# Patient Record
Sex: Female | Born: 1940 | Race: White | Hispanic: No | Marital: Married | Smoking: Never smoker
Health system: Southern US, Community
[De-identification: ages and names within clinical notes are randomized; demographics above are authoritative.]

## PROBLEM LIST (undated history)

## (undated) DIAGNOSIS — C50919 Malignant neoplasm of unspecified site of unspecified female breast: Secondary | ICD-10-CM

## (undated) DIAGNOSIS — F419 Anxiety disorder, unspecified: Secondary | ICD-10-CM

## (undated) DIAGNOSIS — I10 Essential (primary) hypertension: Secondary | ICD-10-CM

## (undated) DIAGNOSIS — E785 Hyperlipidemia, unspecified: Secondary | ICD-10-CM

## (undated) HISTORY — DX: Anxiety disorder, unspecified: F41.9

## (undated) HISTORY — PX: MASTECTOMY: SHX3

## (undated) HISTORY — DX: Malignant neoplasm of unspecified site of unspecified female breast: C50.919

## (undated) HISTORY — DX: Essential (primary) hypertension: I10

## (undated) HISTORY — DX: Hyperlipidemia, unspecified: E78.5

---

## 2000-08-20 ENCOUNTER — Encounter: Payer: Self-pay | Admitting: Family Medicine

## 2000-08-20 ENCOUNTER — Ambulatory Visit (HOSPITAL_COMMUNITY): Admission: RE | Admit: 2000-08-20 | Discharge: 2000-08-20 | Payer: Self-pay | Admitting: Family Medicine

## 2001-09-20 ENCOUNTER — Ambulatory Visit (HOSPITAL_COMMUNITY): Admission: RE | Admit: 2001-09-20 | Discharge: 2001-09-20 | Payer: Self-pay | Admitting: Family Medicine

## 2001-09-20 ENCOUNTER — Encounter: Payer: Self-pay | Admitting: Family Medicine

## 2002-01-05 ENCOUNTER — Other Ambulatory Visit: Admission: RE | Admit: 2002-01-05 | Discharge: 2002-01-05 | Payer: Self-pay | Admitting: Unknown Physician Specialty

## 2002-10-06 ENCOUNTER — Ambulatory Visit (HOSPITAL_COMMUNITY): Admission: RE | Admit: 2002-10-06 | Discharge: 2002-10-06 | Payer: Self-pay | Admitting: Family Medicine

## 2002-10-06 ENCOUNTER — Encounter: Payer: Self-pay | Admitting: Family Medicine

## 2003-10-09 ENCOUNTER — Ambulatory Visit (HOSPITAL_COMMUNITY): Admission: RE | Admit: 2003-10-09 | Discharge: 2003-10-09 | Payer: Self-pay | Admitting: Family Medicine

## 2004-10-14 ENCOUNTER — Ambulatory Visit (HOSPITAL_COMMUNITY): Admission: RE | Admit: 2004-10-14 | Discharge: 2004-10-14 | Payer: Self-pay | Admitting: Family Medicine

## 2006-10-18 ENCOUNTER — Ambulatory Visit (HOSPITAL_COMMUNITY): Admission: RE | Admit: 2006-10-18 | Discharge: 2006-10-18 | Payer: Self-pay | Admitting: *Deleted

## 2007-11-02 ENCOUNTER — Ambulatory Visit (HOSPITAL_COMMUNITY): Admission: RE | Admit: 2007-11-02 | Discharge: 2007-11-02 | Payer: Self-pay | Admitting: *Deleted

## 2007-11-15 ENCOUNTER — Encounter: Admission: RE | Admit: 2007-11-15 | Discharge: 2007-11-15 | Payer: Self-pay | Admitting: *Deleted

## 2007-11-15 ENCOUNTER — Encounter (INDEPENDENT_AMBULATORY_CARE_PROVIDER_SITE_OTHER): Payer: Self-pay | Admitting: Diagnostic Radiology

## 2007-11-30 ENCOUNTER — Encounter: Admission: RE | Admit: 2007-11-30 | Discharge: 2007-11-30 | Payer: Self-pay | Admitting: *Deleted

## 2007-12-01 ENCOUNTER — Encounter (INDEPENDENT_AMBULATORY_CARE_PROVIDER_SITE_OTHER): Payer: Self-pay | Admitting: Diagnostic Radiology

## 2007-12-01 ENCOUNTER — Encounter: Admission: RE | Admit: 2007-12-01 | Discharge: 2007-12-01 | Payer: Self-pay | Admitting: Surgery

## 2007-12-09 ENCOUNTER — Encounter (INDEPENDENT_AMBULATORY_CARE_PROVIDER_SITE_OTHER): Payer: Self-pay | Admitting: Diagnostic Radiology

## 2007-12-09 ENCOUNTER — Encounter: Admission: RE | Admit: 2007-12-09 | Discharge: 2007-12-09 | Payer: Self-pay | Admitting: Surgery

## 2007-12-14 ENCOUNTER — Ambulatory Visit: Payer: Self-pay | Admitting: Oncology

## 2008-01-24 ENCOUNTER — Ambulatory Visit: Payer: Self-pay | Admitting: Cardiology

## 2008-01-26 ENCOUNTER — Encounter (INDEPENDENT_AMBULATORY_CARE_PROVIDER_SITE_OTHER): Payer: Self-pay | Admitting: Surgery

## 2008-01-26 ENCOUNTER — Ambulatory Visit (HOSPITAL_COMMUNITY): Admission: RE | Admit: 2008-01-26 | Discharge: 2008-01-27 | Payer: Self-pay | Admitting: Surgery

## 2008-06-05 ENCOUNTER — Encounter: Admission: RE | Admit: 2008-06-05 | Discharge: 2008-06-05 | Payer: Self-pay | Admitting: Hematology and Oncology

## 2008-07-30 ENCOUNTER — Ambulatory Visit: Admission: RE | Admit: 2008-07-30 | Discharge: 2008-09-11 | Payer: Self-pay | Admitting: Radiation Oncology

## 2008-08-20 ENCOUNTER — Ambulatory Visit: Payer: Self-pay | Admitting: Cardiology

## 2008-09-18 ENCOUNTER — Ambulatory Visit: Admission: RE | Admit: 2008-09-18 | Discharge: 2008-10-04 | Payer: Self-pay | Admitting: Radiation Oncology

## 2008-11-28 ENCOUNTER — Encounter: Admission: RE | Admit: 2008-11-28 | Discharge: 2008-11-28 | Payer: Self-pay | Admitting: Hematology and Oncology

## 2009-11-29 ENCOUNTER — Encounter: Admission: RE | Admit: 2009-11-29 | Discharge: 2009-11-29 | Payer: Self-pay | Admitting: Hematology and Oncology

## 2010-01-30 ENCOUNTER — Ambulatory Visit (HOSPITAL_BASED_OUTPATIENT_CLINIC_OR_DEPARTMENT_OTHER): Admission: RE | Admit: 2010-01-30 | Discharge: 2010-01-30 | Payer: Self-pay | Admitting: Surgery

## 2010-06-23 ENCOUNTER — Ambulatory Visit (HOSPITAL_BASED_OUTPATIENT_CLINIC_OR_DEPARTMENT_OTHER): Admission: RE | Admit: 2010-06-23 | Discharge: 2010-06-23 | Payer: Self-pay | Admitting: Surgery

## 2010-10-04 ENCOUNTER — Other Ambulatory Visit: Payer: Self-pay | Admitting: Hematology and Oncology

## 2010-10-04 DIAGNOSIS — Z9012 Acquired absence of left breast and nipple: Secondary | ICD-10-CM

## 2010-10-05 ENCOUNTER — Encounter: Payer: Self-pay | Admitting: Hematology and Oncology

## 2010-12-01 ENCOUNTER — Ambulatory Visit
Admission: RE | Admit: 2010-12-01 | Discharge: 2010-12-01 | Disposition: A | Discharge status: Home / Self Care | Payer: Medicare Other | Source: Ambulatory Visit | Attending: Hematology and Oncology | Admitting: Hematology and Oncology

## 2010-12-01 DIAGNOSIS — Z9012 Acquired absence of left breast and nipple: Secondary | ICD-10-CM

## 2011-01-27 NOTE — Op Note (Signed)
NAME:  ZIAN, Erin Shaffer NO.:  [id number]   MEDICAL RECORD NO.:  [id number]          PATIENT TYPE:  OBV   LOCATION:  5126                         FACILITY:  [facility]   PHYSICIAN:  Currie Paris, M.D.DATE OF BIRTH:  [birth year]   DATE OF PROCEDURE:  01/26/2008  DATE OF DISCHARGE:                               OPERATIVE REPORT   PREOPERATIVE DIAGNOSIS:  Left breast cancer, lower inner quadrant,  clinical stage II.   POSTOPERATIVE DIAGNOSIS:  Left breast cancer, lower inner quadrant,  clinical stage II.   OPERATION:  Left modified radical mastectomy; Port-A-Cath placement.   SURGEON:  Currie Paris, MD   ASSISTANT:  Angelia Mould. Derrell Lolling, MD   ANESTHESIA:  General.   CLINICAL HISTORY:  This is a 70 year old lady, recently found to have  left breast cancer with axillary metastasis.  After a lengthy  preoperative evaluation including on oncology consultation, she elected  to have a total mastectomy and postoperative chemotherapy.  She  understood the need for node dissection at the time of surgery.   DESCRIPTION OF PROCEDURE:  The patient was seen in the holding area and  she had no further questions.  We confirmed the above procedure as  outlined and I initialed left breast as the operative site.   The patient was taken to the operating room, and after satisfactory  general anesthesia had been obtained, the left breast was prepped and  draped as sterile field.  The time-out was performed.   I outlined an elliptical incision.  I raised the skin flap medially to  the sternum, superiorly to the clavicle, and laterally into the axilla.  The inferior incision was made.  The flap raised to below the  inframammary fold and rectus sheath and out to the latissimus.   The breast was removed from medial to lateral taking the fascia.  As I  entered the edge of the pectoralis, I opened the clavipectoral fascia  and swept the axillary contents out starting medial and  working lateral,  superior, and inferior.  I stayed below the vein.  I preserved the long  thoracic and thoracodorsal nerves as well as the nerve to the  pectoralis.  Multiple involved nodes appeared to be in there and all  were taken.  The final contents were detached from the edge of  latissimus and the specimen sent off.  I spent several minutes  irrigating and making sure everything was dry.  I tested nerves and they  seemed to work.  I put two 19 Blake drains and secured them with 2-0  nylons.  I closed the wound with staples.   Using all new instruments, we reprepped and draped and placed the  patient in Trendelenburg.  A second time-out was performed.   The right infraclavicular area was approached and the subclavian vein  entered on initial attempt and the guidewire threaded using fluoro into  the superior vena cava.  I made a transverse incision and made a  subcutaneous pocket in the anterior chest wall and brought the Port-A-  Cath tubing through the pocket.  The tract  of the guidewire was dilated  once and the dilator peel-away sheath was removed.  This was done under  fluoro.  The Port-A-Cath tubing was threaded 3 to 17 cm and the peel-  away sheath was removed.  It aspirated and flushed easily.  We had the  fluoro on and backed this up little bit, was about 16 and 1/2 cm where  it appeared to be in the distal superior vena cava.  The catheter  aspirated and flushed and the reservoir was flushed, attached in the  locking mechanism engaged.  This was aspirated and flushed easily.   The reservoir was secured with some 2-0 Prolene to the fascia.  We did a  final fluoro and everything appeared to be in good position.  The  catheter was flushed with concentrated aqueous heparin.  The incision  was closed with some 3-0 Vicryl, 4-0 Monocryl subcuticular, and  Dermabond.   I noted at this point that one of the drains on the left side was not  holding suction, so I removed some of  the dressings as we had only  temporary coverage and put an extra suture around the drain and put a  little bit more pressure on and got the drain working appropriately.  There had been no bleeding on that side that were working noted.   The patient tolerated the procedure well and there were no operative  complications.  All counts were correct.      Currie Paris, M.D.  Electronically Signed     CJS/MEDQ  D:  01/26/2008  T:  01/27/2008  Job:  [id number]   cc:   Lynett Fish, M.D.  Donzetta Sprung

## 2011-06-08 ENCOUNTER — Other Ambulatory Visit: Payer: Self-pay | Admitting: Hematology and Oncology

## 2011-06-08 DIAGNOSIS — Z1231 Encounter for screening mammogram for malignant neoplasm of breast: Secondary | ICD-10-CM

## 2011-06-08 DIAGNOSIS — Z9010 Acquired absence of unspecified breast and nipple: Secondary | ICD-10-CM

## 2011-12-03 ENCOUNTER — Ambulatory Visit
Admission: RE | Admit: 2011-12-03 | Discharge: 2011-12-03 | Disposition: A | Discharge status: Home / Self Care | Payer: Medicare Other | Source: Ambulatory Visit | Attending: Hematology and Oncology | Admitting: Hematology and Oncology

## 2011-12-03 DIAGNOSIS — Z9010 Acquired absence of unspecified breast and nipple: Secondary | ICD-10-CM

## 2011-12-03 DIAGNOSIS — Z1231 Encounter for screening mammogram for malignant neoplasm of breast: Secondary | ICD-10-CM

## 2012-01-20 ENCOUNTER — Encounter (INDEPENDENT_AMBULATORY_CARE_PROVIDER_SITE_OTHER): Payer: Self-pay

## 2012-05-31 ENCOUNTER — Encounter: Payer: Medicare Other | Admitting: Hematology and Oncology

## 2012-05-31 ENCOUNTER — Other Ambulatory Visit: Payer: Self-pay | Admitting: Hematology and Oncology

## 2012-05-31 DIAGNOSIS — K219 Gastro-esophageal reflux disease without esophagitis: Secondary | ICD-10-CM

## 2012-05-31 DIAGNOSIS — G8918 Other acute postprocedural pain: Secondary | ICD-10-CM

## 2012-05-31 DIAGNOSIS — I10 Essential (primary) hypertension: Secondary | ICD-10-CM

## 2012-05-31 DIAGNOSIS — Z1231 Encounter for screening mammogram for malignant neoplasm of breast: Secondary | ICD-10-CM

## 2012-05-31 DIAGNOSIS — Z9012 Acquired absence of left breast and nipple: Secondary | ICD-10-CM

## 2012-05-31 DIAGNOSIS — C50919 Malignant neoplasm of unspecified site of unspecified female breast: Secondary | ICD-10-CM

## 2012-12-05 ENCOUNTER — Ambulatory Visit
Admission: RE | Admit: 2012-12-05 | Discharge: 2012-12-05 | Disposition: A | Discharge status: Home / Self Care | Payer: Medicare Other | Source: Ambulatory Visit | Attending: Hematology and Oncology | Admitting: Hematology and Oncology

## 2012-12-05 DIAGNOSIS — Z9012 Acquired absence of left breast and nipple: Secondary | ICD-10-CM

## 2012-12-05 DIAGNOSIS — Z1231 Encounter for screening mammogram for malignant neoplasm of breast: Secondary | ICD-10-CM

## 2012-12-12 ENCOUNTER — Encounter: Payer: Medicare Other | Admitting: Internal Medicine

## 2012-12-12 DIAGNOSIS — Z170 Estrogen receptor positive status [ER+]: Secondary | ICD-10-CM

## 2012-12-12 DIAGNOSIS — G8918 Other acute postprocedural pain: Secondary | ICD-10-CM

## 2012-12-12 DIAGNOSIS — C50919 Malignant neoplasm of unspecified site of unspecified female breast: Secondary | ICD-10-CM

## 2013-10-30 ENCOUNTER — Other Ambulatory Visit: Payer: Self-pay

## 2013-10-30 DIAGNOSIS — Z1231 Encounter for screening mammogram for malignant neoplasm of breast: Secondary | ICD-10-CM

## 2013-10-30 DIAGNOSIS — Z9012 Acquired absence of left breast and nipple: Secondary | ICD-10-CM

## 2013-12-07 ENCOUNTER — Ambulatory Visit
Admission: RE | Admit: 2013-12-07 | Discharge: 2013-12-07 | Disposition: A | Discharge status: Home / Self Care | Payer: Medicare Other | Source: Ambulatory Visit

## 2013-12-07 DIAGNOSIS — Z9012 Acquired absence of left breast and nipple: Secondary | ICD-10-CM

## 2013-12-07 DIAGNOSIS — Z1231 Encounter for screening mammogram for malignant neoplasm of breast: Secondary | ICD-10-CM

## 2014-11-06 ENCOUNTER — Other Ambulatory Visit: Payer: Self-pay

## 2014-11-06 DIAGNOSIS — Z1231 Encounter for screening mammogram for malignant neoplasm of breast: Secondary | ICD-10-CM

## 2014-11-06 DIAGNOSIS — Z9011 Acquired absence of right breast and nipple: Secondary | ICD-10-CM

## 2014-12-10 ENCOUNTER — Ambulatory Visit: Payer: Medicare Other

## 2014-12-10 ENCOUNTER — Ambulatory Visit
Admission: RE | Admit: 2014-12-10 | Discharge: 2014-12-10 | Disposition: A | Discharge status: Home / Self Care | Payer: Medicare Other | Source: Ambulatory Visit

## 2014-12-10 DIAGNOSIS — Z9011 Acquired absence of right breast and nipple: Secondary | ICD-10-CM

## 2014-12-10 DIAGNOSIS — Z1231 Encounter for screening mammogram for malignant neoplasm of breast: Secondary | ICD-10-CM

## 2015-10-11 ENCOUNTER — Other Ambulatory Visit: Payer: Self-pay | Admitting: Family Medicine

## 2015-10-11 DIAGNOSIS — Z1231 Encounter for screening mammogram for malignant neoplasm of breast: Secondary | ICD-10-CM

## 2015-10-11 DIAGNOSIS — Z9012 Acquired absence of left breast and nipple: Secondary | ICD-10-CM

## 2015-10-18 ENCOUNTER — Ambulatory Visit: Payer: Medicare Other

## 2015-12-11 ENCOUNTER — Ambulatory Visit
Admission: RE | Admit: 2015-12-11 | Discharge: 2015-12-11 | Disposition: A | Discharge status: Home / Self Care | Payer: Medicare Other | Source: Ambulatory Visit | Attending: Family Medicine | Admitting: Family Medicine

## 2015-12-11 DIAGNOSIS — Z1231 Encounter for screening mammogram for malignant neoplasm of breast: Secondary | ICD-10-CM

## 2015-12-11 DIAGNOSIS — Z9012 Acquired absence of left breast and nipple: Secondary | ICD-10-CM

## 2016-11-17 ENCOUNTER — Other Ambulatory Visit: Payer: Self-pay | Admitting: Family Medicine

## 2016-11-17 DIAGNOSIS — Z1231 Encounter for screening mammogram for malignant neoplasm of breast: Secondary | ICD-10-CM

## 2016-12-14 ENCOUNTER — Ambulatory Visit
Admission: RE | Admit: 2016-12-14 | Discharge: 2016-12-14 | Disposition: A | Discharge status: Home / Self Care | Payer: Medicare Other | Source: Ambulatory Visit | Attending: Family Medicine | Admitting: Family Medicine

## 2016-12-14 DIAGNOSIS — Z1231 Encounter for screening mammogram for malignant neoplasm of breast: Secondary | ICD-10-CM

## 2017-12-21 ENCOUNTER — Other Ambulatory Visit: Payer: Self-pay | Admitting: Family Medicine

## 2017-12-21 DIAGNOSIS — Z139 Encounter for screening, unspecified: Secondary | ICD-10-CM

## 2017-12-28 ENCOUNTER — Ambulatory Visit
Admission: RE | Admit: 2017-12-28 | Discharge: 2017-12-28 | Disposition: A | Discharge status: Home / Self Care | Payer: Medicare Other | Source: Ambulatory Visit | Attending: Family Medicine | Admitting: Family Medicine

## 2017-12-28 DIAGNOSIS — Z139 Encounter for screening, unspecified: Secondary | ICD-10-CM

## 2018-06-19 ENCOUNTER — Emergency Department (HOSPITAL_COMMUNITY)
Admission: EM | Admit: 2018-06-19 | Discharge: 2018-06-19 | Disposition: A | Discharge status: Home / Self Care | Payer: Medicare Other | Attending: Emergency Medicine | Admitting: Emergency Medicine

## 2018-06-19 ENCOUNTER — Emergency Department (HOSPITAL_COMMUNITY): Payer: Medicare Other

## 2018-06-19 ENCOUNTER — Other Ambulatory Visit: Payer: Self-pay

## 2018-06-19 DIAGNOSIS — Y939 Activity, unspecified: Secondary | ICD-10-CM | POA: Insufficient documentation

## 2018-06-19 DIAGNOSIS — S52592A Other fractures of lower end of left radius, initial encounter for closed fracture: Secondary | ICD-10-CM | POA: Insufficient documentation

## 2018-06-19 DIAGNOSIS — S6992XA Unspecified injury of left wrist, hand and finger(s), initial encounter: Secondary | ICD-10-CM | POA: Diagnosis present

## 2018-06-19 DIAGNOSIS — Y999 Unspecified external cause status: Secondary | ICD-10-CM | POA: Insufficient documentation

## 2018-06-19 DIAGNOSIS — G5612 Other lesions of median nerve, left upper limb: Secondary | ICD-10-CM | POA: Diagnosis not present

## 2018-06-19 DIAGNOSIS — Y929 Unspecified place or not applicable: Secondary | ICD-10-CM | POA: Diagnosis not present

## 2018-06-19 DIAGNOSIS — W01198A Fall on same level from slipping, tripping and stumbling with subsequent striking against other object, initial encounter: Secondary | ICD-10-CM | POA: Insufficient documentation

## 2018-06-19 MED ORDER — HYDROCODONE-ACETAMINOPHEN 5-325 MG PO TABS: 1.0000 | ORAL_TABLET | ORAL | 0 refills | Status: DC | PRN

## 2018-06-19 NOTE — ED Provider Notes (Signed)
[facility] EMERGENCY DEPARTMENT Provider Note   CSN: [id number] Arrival date & time: 06/19/18  0007     History   Chief Complaint Chief Complaint  Patient presents with  . Fall    left wrist pain, numbness to 1st three digits    HPI Erin Shaffer is a 77 y.o. female.  The history is provided by the patient.  Fall  This is a new problem. The current episode started 6 to 12 hours ago. The problem occurs constantly. The problem has been gradually worsening. Pertinent negatives include no chest pain and no headaches. Nothing aggravates the symptoms. Nothing relieves the symptoms.   Presents with fall and left wrist pain.  She reports several hours ago she tripped and fell landing on outstretched hand.  She reports since that time she has had mild pain and swelling to left wrist.  She also reports some numbness in her thumb/index/middle finger.  No weakness.  No lacerations.  No head injury or LOC.  No other acute complaints  PMH-none Soc hx - nonsmoker Past Surgical History:  Procedure Laterality Date  . MASTECTOMY Left    malignant     OB History   None      Home Medications    Prior to Admission medications   Not on File    Family History No family history on file.  Social History Social History   Tobacco Use  . Smoking status: Not on file  Substance Use Topics  . Alcohol use: Not on file  . Drug use: Not on file     Allergies   Patient has no known allergies.   Review of Systems Review of Systems  Cardiovascular: Negative for chest pain.  Musculoskeletal: Positive for arthralgias and joint swelling. Negative for neck pain.  Neurological: Positive for numbness. Negative for weakness and headaches.  All other systems reviewed and are negative.    Physical Exam Updated Vital Signs BP 117/65 (BP Location: Right Arm)   Pulse 75   Temp 98.5 F (36.9 C) (Oral)   Resp 16   Ht 1.727 m (5\' 8" )   Wt 76.7 kg   SpO2 93%   BMI 25.70 kg/m    Physical Exam CONSTITUTIONAL: Well developed/well nourished HEAD: Normocephalic/atraumatic EYES: EOMI ENMT: Mucous membranes moist NECK: supple no meningeal signs SPINE/BACK: No cervical spine tenderness CV: S1/S2 noted, no murmurs/rubs/gallops noted LUNGS: Lungs are clear to auscultation bilaterally, no apparent distress ABDOMEN: soft, nontender NEURO: Pt is awake/alert/appropriate, moves all extremitiesx4.  No facial droop.  She reports numbness to left thumb/index/middle finger.  However she has good strength with all of her fingers on the left hand.  Distal motor function is intact in left hand. EXTREMITIES: pulses normal/equal, full ROM, tenderness and swelling to left wrist, tenderness to left snuffbox, no left hand tenderness SKIN: warm, color normal PSYCH: no abnormalities of mood noted, alert and oriented to situation   ED Treatments / Results  Labs (all labs ordered are listed, but only abnormal results are displayed) Labs Reviewed - No data to display  EKG None  Radiology Dg Wrist Complete Left  Result Date: 06/19/2018 CLINICAL DATA:  Left wrist pain. Second and third digit numbness. Patient reports fall this afternoon with hyperextension injury. EXAM: LEFT WRIST - COMPLETE 3+ VIEW COMPARISON:  None. FINDINGS: Minimally displaced fracture from the dorsal distal radius and possibly dorsal distal ulna, only seen on the lateral view. No definite intra-articular extension. Carpal bones are intact. There is soft tissue edema  about the wrist. IMPRESSION: Minimally displaced fracture about the dorsal distal radius and possibly distal ulna, seen only on a single view. Electronically Signed   By: Keith Rake M.D.   On: 06/19/2018 01:38    Procedures Procedures  SPLINT APPLICATION Date/Time: 6:16 AM Authorized by: Sharyon Cable Consent: Verbal consent obtained. Risks and benefits: risks, benefits and alternatives were discussed Consent given by: patient Splint  applied by: nurse Location details: left arm Splint type: sugartogn Supplies used: ortho glass Post-procedure: The splinted body part was neurovascularly unchanged following the procedure. Patient tolerance: Patient tolerated the procedure well with no immediate complications.    Medications Ordered in ED Medications - No data to display   Initial Impression / Assessment and Plan / ED Course  I have reviewed the triage vital signs and the nursing notes.  Pertinent  imaging results that were available during my care of the patient were reviewed by me and considered in my medical decision making (see chart for details). Narcotic database reviewed and considered in decision making     Patient presents after mechanical fall, sustaining mildly displaced distal radius fracture.  Her main concern is that she has numbness in her hand, and in fact it is in the distribution of the median nerve. She mostly has sensory deficit, no weakness.  She is getting appropriate vascular supply to her hand.  I discussed the case with Dr. Amedeo Plenty on-call for hand surgery.  He would like to see her in a little over 24 hours at his office, as this may require operative management.  This has been discussed with the patient.  She agrees with the plan.  She reports she no longer has pain medicine at home, therefore will send Vicodin to her pharmacy.  Final Clinical Impressions(s) / ED Diagnoses   Final diagnoses:  Other closed fracture of distal end of left radius, initial encounter  Left median nerve neuropathy    ED Discharge Orders         Ordered    HYDROcodone-acetaminophen (NORCO/VICODIN) 5-325 MG tablet  Every 4 hours PRN     06/19/18 0252           Ripley Fraise, MD 06/19/18 [phone]

## 2018-06-19 NOTE — ED Triage Notes (Signed)
Pt reports falling earlier this afternoon and catching herself with left hand (left wrist hyperextension) Bruising noted to left wrist, pt able to move wrist but reports pain. Cap refill <3 seconds. Pulses intact. Pt reports thumb and 1st two digits are "going numb" but as long as she rubs them it keeps from feeling numb.

## 2018-06-20 DIAGNOSIS — M25539 Pain in unspecified wrist: Secondary | ICD-10-CM | POA: Insufficient documentation

## 2019-02-07 ENCOUNTER — Other Ambulatory Visit: Payer: Self-pay | Admitting: Family Medicine

## 2019-02-07 ENCOUNTER — Other Ambulatory Visit: Payer: Self-pay | Admitting: Specialist

## 2019-02-07 DIAGNOSIS — Z1231 Encounter for screening mammogram for malignant neoplasm of breast: Secondary | ICD-10-CM

## 2019-03-11 ENCOUNTER — Ambulatory Visit: Payer: Medicare Other

## 2019-03-29 ENCOUNTER — Ambulatory Visit
Admission: RE | Admit: 2019-03-29 | Discharge: 2019-03-29 | Disposition: A | Discharge status: Home / Self Care | Payer: Medicare Other | Source: Ambulatory Visit | Attending: Family Medicine | Admitting: Family Medicine

## 2019-03-29 ENCOUNTER — Other Ambulatory Visit: Payer: Self-pay

## 2019-03-29 DIAGNOSIS — Z1231 Encounter for screening mammogram for malignant neoplasm of breast: Secondary | ICD-10-CM

## 2020-05-07 ENCOUNTER — Other Ambulatory Visit: Payer: Self-pay | Admitting: Family Medicine

## 2020-05-07 DIAGNOSIS — Z1231 Encounter for screening mammogram for malignant neoplasm of breast: Secondary | ICD-10-CM

## 2020-05-16 ENCOUNTER — Ambulatory Visit
Admission: RE | Admit: 2020-05-16 | Discharge: 2020-05-16 | Disposition: A | Discharge status: Home / Self Care | Payer: Medicare Other | Source: Ambulatory Visit | Attending: Family Medicine | Admitting: Family Medicine

## 2020-05-16 ENCOUNTER — Other Ambulatory Visit: Payer: Self-pay

## 2020-05-16 DIAGNOSIS — Z1231 Encounter for screening mammogram for malignant neoplasm of breast: Secondary | ICD-10-CM

## 2021-06-09 ENCOUNTER — Other Ambulatory Visit: Payer: Self-pay | Admitting: Family Medicine

## 2021-06-09 DIAGNOSIS — Z1231 Encounter for screening mammogram for malignant neoplasm of breast: Secondary | ICD-10-CM

## 2021-08-04 ENCOUNTER — Ambulatory Visit
Admission: RE | Admit: 2021-08-04 | Discharge: 2021-08-04 | Disposition: A | Discharge status: Home / Self Care | Payer: Medicare Other | Source: Ambulatory Visit | Attending: Family Medicine | Admitting: Family Medicine

## 2021-08-04 ENCOUNTER — Other Ambulatory Visit: Payer: Self-pay

## 2021-08-04 DIAGNOSIS — Z1231 Encounter for screening mammogram for malignant neoplasm of breast: Secondary | ICD-10-CM

## 2023-05-12 ENCOUNTER — Emergency Department (HOSPITAL_COMMUNITY): Payer: Medicare Other

## 2023-05-12 ENCOUNTER — Encounter (HOSPITAL_COMMUNITY): Payer: Self-pay

## 2023-05-12 ENCOUNTER — Emergency Department (HOSPITAL_COMMUNITY)
Admission: EM | Admit: 2023-05-12 | Discharge: 2023-05-12 | Disposition: A | Discharge status: Home / Self Care | Payer: Medicare Other | Attending: Emergency Medicine | Admitting: Emergency Medicine

## 2023-05-12 ENCOUNTER — Other Ambulatory Visit: Payer: Self-pay

## 2023-05-12 DIAGNOSIS — S301XXA Contusion of abdominal wall, initial encounter: Secondary | ICD-10-CM | POA: Diagnosis not present

## 2023-05-12 DIAGNOSIS — M25552 Pain in left hip: Secondary | ICD-10-CM | POA: Insufficient documentation

## 2023-05-12 DIAGNOSIS — R079 Chest pain, unspecified: Secondary | ICD-10-CM | POA: Diagnosis present

## 2023-05-12 DIAGNOSIS — R9082 White matter disease, unspecified: Secondary | ICD-10-CM | POA: Diagnosis not present

## 2023-05-12 DIAGNOSIS — S42022A Displaced fracture of shaft of left clavicle, initial encounter for closed fracture: Secondary | ICD-10-CM | POA: Diagnosis not present

## 2023-05-12 DIAGNOSIS — S32018A Other fracture of first lumbar vertebra, initial encounter for closed fracture: Secondary | ICD-10-CM | POA: Diagnosis not present

## 2023-05-12 DIAGNOSIS — Y92410 Unspecified street and highway as the place of occurrence of the external cause: Secondary | ICD-10-CM | POA: Diagnosis not present

## 2023-05-12 DIAGNOSIS — S42002A Fracture of unspecified part of left clavicle, initial encounter for closed fracture: Secondary | ICD-10-CM

## 2023-05-12 DIAGNOSIS — S32009A Unspecified fracture of unspecified lumbar vertebra, initial encounter for closed fracture: Secondary | ICD-10-CM

## 2023-05-12 LAB — CBC
HCT: 33.2 % — ABNORMAL LOW (ref 36.0–46.0)
Hemoglobin: 11.3 g/dL — ABNORMAL LOW (ref 12.0–15.0)
MCH: 31.2 pg (ref 26.0–34.0)
MCHC: 34 g/dL (ref 30.0–36.0)
MCV: 91.7 fL (ref 80.0–100.0)
Platelets: 241 10*3/uL (ref 150–400)
RBC: 3.62 MIL/uL — ABNORMAL LOW (ref 3.87–5.11)
RDW: 12.5 % (ref 11.5–15.5)
WBC: 17.8 10*3/uL — ABNORMAL HIGH (ref 4.0–10.5)
nRBC: 0 % (ref 0.0–0.2)

## 2023-05-12 LAB — BASIC METABOLIC PANEL
Anion gap: 11 (ref 5–15)
BUN: 21 mg/dL (ref 8–23)
CO2: 23 mmol/L (ref 22–32)
Calcium: 9 mg/dL (ref 8.9–10.3)
Chloride: 99 mmol/L (ref 98–111)
Creatinine, Ser: 1.06 mg/dL — ABNORMAL HIGH (ref 0.44–1.00)
GFR, Estimated: 53 mL/min — ABNORMAL LOW (ref >60)
Glucose, Bld: 168 mg/dL — ABNORMAL HIGH (ref 70–99)
Potassium: 3.6 mmol/L (ref 3.5–5.1)
Sodium: 133 mmol/L — ABNORMAL LOW (ref 135–145)

## 2023-05-12 MED ORDER — HYDROMORPHONE HCL 1 MG/ML IJ SOLN
0.5000 mg | Freq: Once | INTRAMUSCULAR | Status: AC
  Administered 2023-05-12: 0.5 mg via INTRAVENOUS
  Filled 2023-05-12: qty 0.5

## 2023-05-12 MED ORDER — ONDANSETRON 4 MG PO TBDP
4.0000 mg | ORAL_TABLET | Freq: Once | ORAL | Status: AC
  Administered 2023-05-12: 4 mg via ORAL

## 2023-05-12 MED ORDER — OXYCODONE-ACETAMINOPHEN 5-325 MG PO TABS: 0.5000 | ORAL_TABLET | Freq: Three times a day (TID) | ORAL | 0 refills | Status: AC | PRN

## 2023-05-12 MED ORDER — FENTANYL CITRATE PF 50 MCG/ML IJ SOSY
50.0000 ug | PREFILLED_SYRINGE | Freq: Once | INTRAMUSCULAR | Status: AC
  Administered 2023-05-12: 50 ug via INTRAVENOUS
  Filled 2023-05-12: qty 1

## 2023-05-12 MED ORDER — IOHEXOL 300 MG/ML  SOLN
100.0000 mL | Freq: Once | INTRAMUSCULAR | Status: AC | PRN
  Administered 2023-05-12: 100 mL via INTRAVENOUS

## 2023-05-12 MED ORDER — ONDANSETRON 4 MG PO TBDP
ORAL_TABLET | ORAL | Status: AC
  Filled 2023-05-12: qty 1

## 2023-05-12 MED ORDER — OXYCODONE-ACETAMINOPHEN 5-325 MG PO TABS: 0.5000 | ORAL_TABLET | Freq: Three times a day (TID) | ORAL | 0 refills | Status: DC | PRN
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
## 2023-05-12 NOTE — ED Notes (Signed)
Patient transported to CT/XR ?
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
## 2023-05-12 NOTE — ED Notes (Signed)
Pt given mouth swabs at this time for dry mouth.

## 2023-05-12 NOTE — Discharge Instructions (Signed)
The origin of your sartorius may also be avulsed.  You have been given some stronger pain medicine to take if needed.  Follow-up with Dr. Romeo Apple.

## 2023-05-14 ENCOUNTER — Telehealth: Payer: Self-pay | Admitting: Orthopedic Surgery
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
## 2023-05-17 IMAGING — MG MM DIGITAL SCREENING UNILAT*R* W/ TOMO W/ CAD
4 series · 4 of 12 positions shown · non-contrast
Comparison: Previous exam(s).

CLINICAL DATA: Screening.

EXAM:
DIGITAL SCREENING UNILATERAL RIGHT MAMMOGRAM WITH CAD AND
TOMOSYNTHESIS
TECHNIQUE: Right screening digital craniocaudal and mediolateral oblique
mammograms were obtained. Right screening digital breast
tomosynthesis was performed. The images were evaluated with
computer-aided detection.

[R MLO synth-2D]
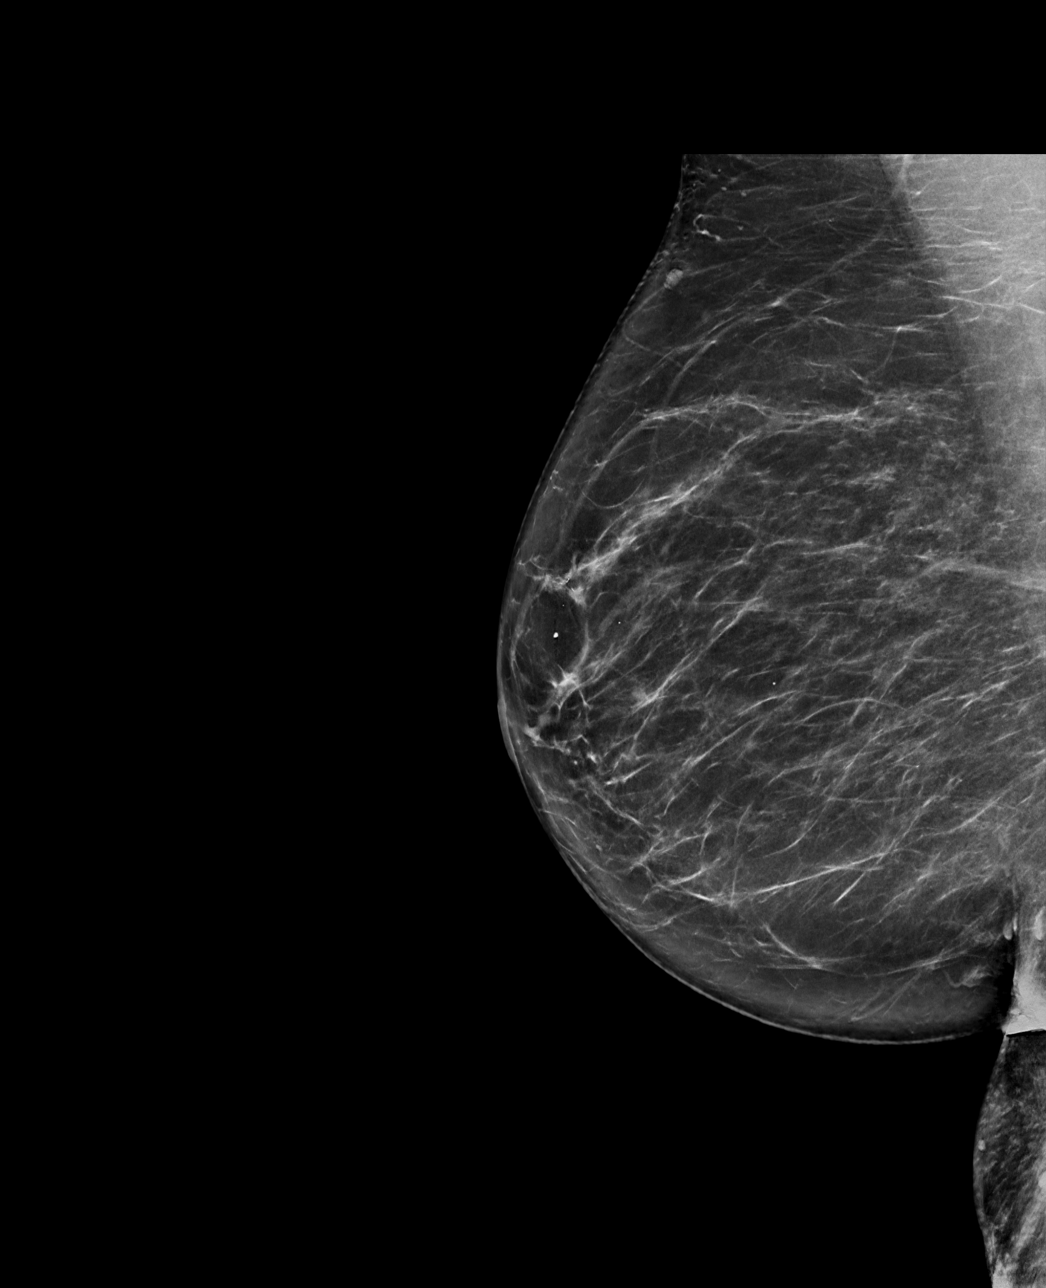

[R CC synth-2D]
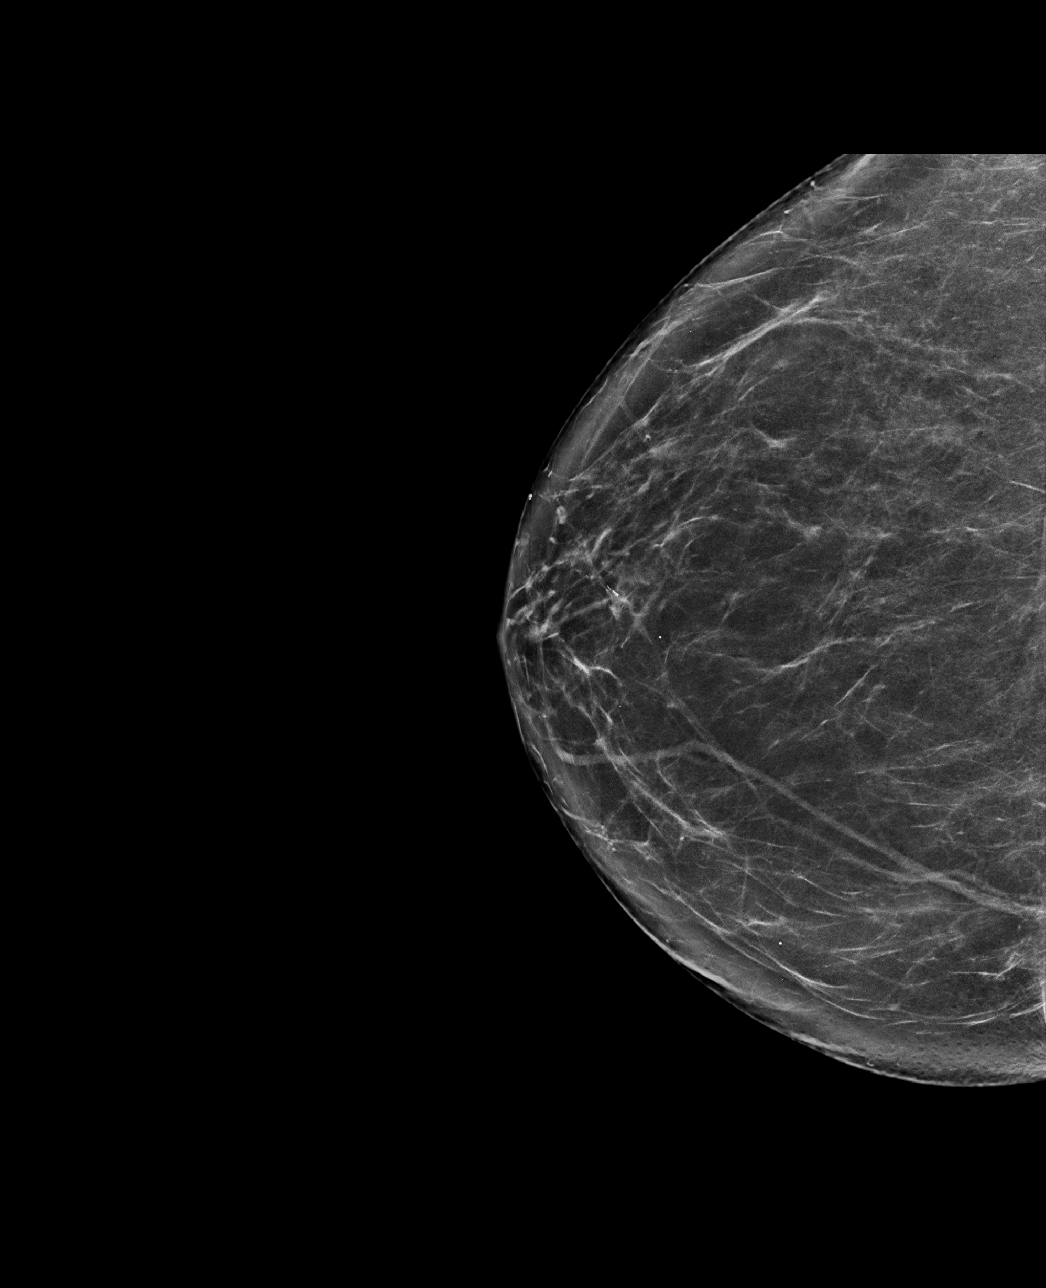

[R MLO tomo · tomo slice 43/86.0]
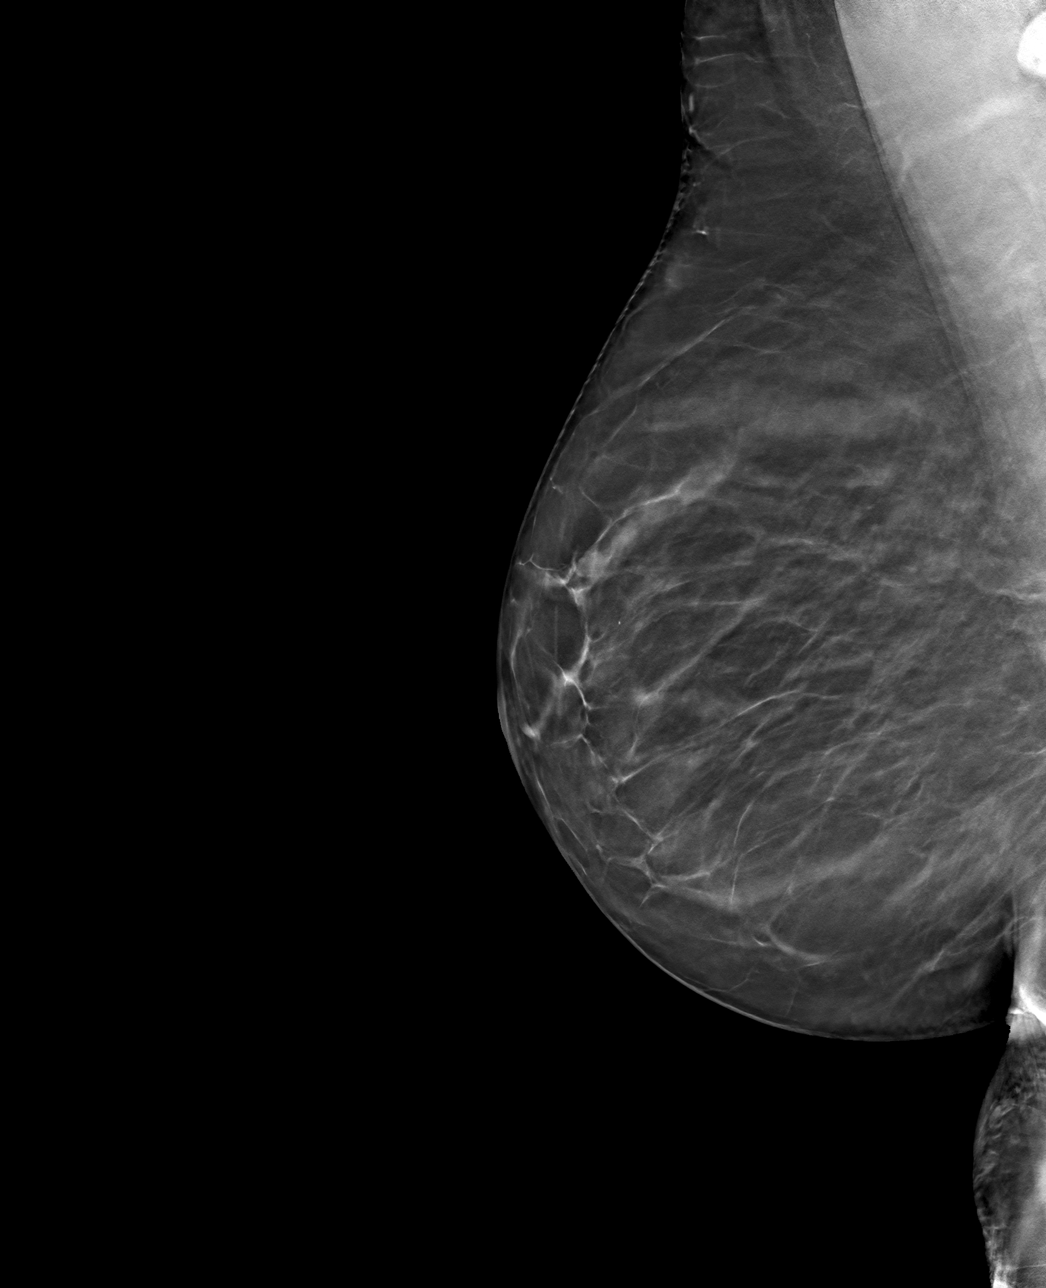

[R CC tomo · tomo slice 42/83.0]
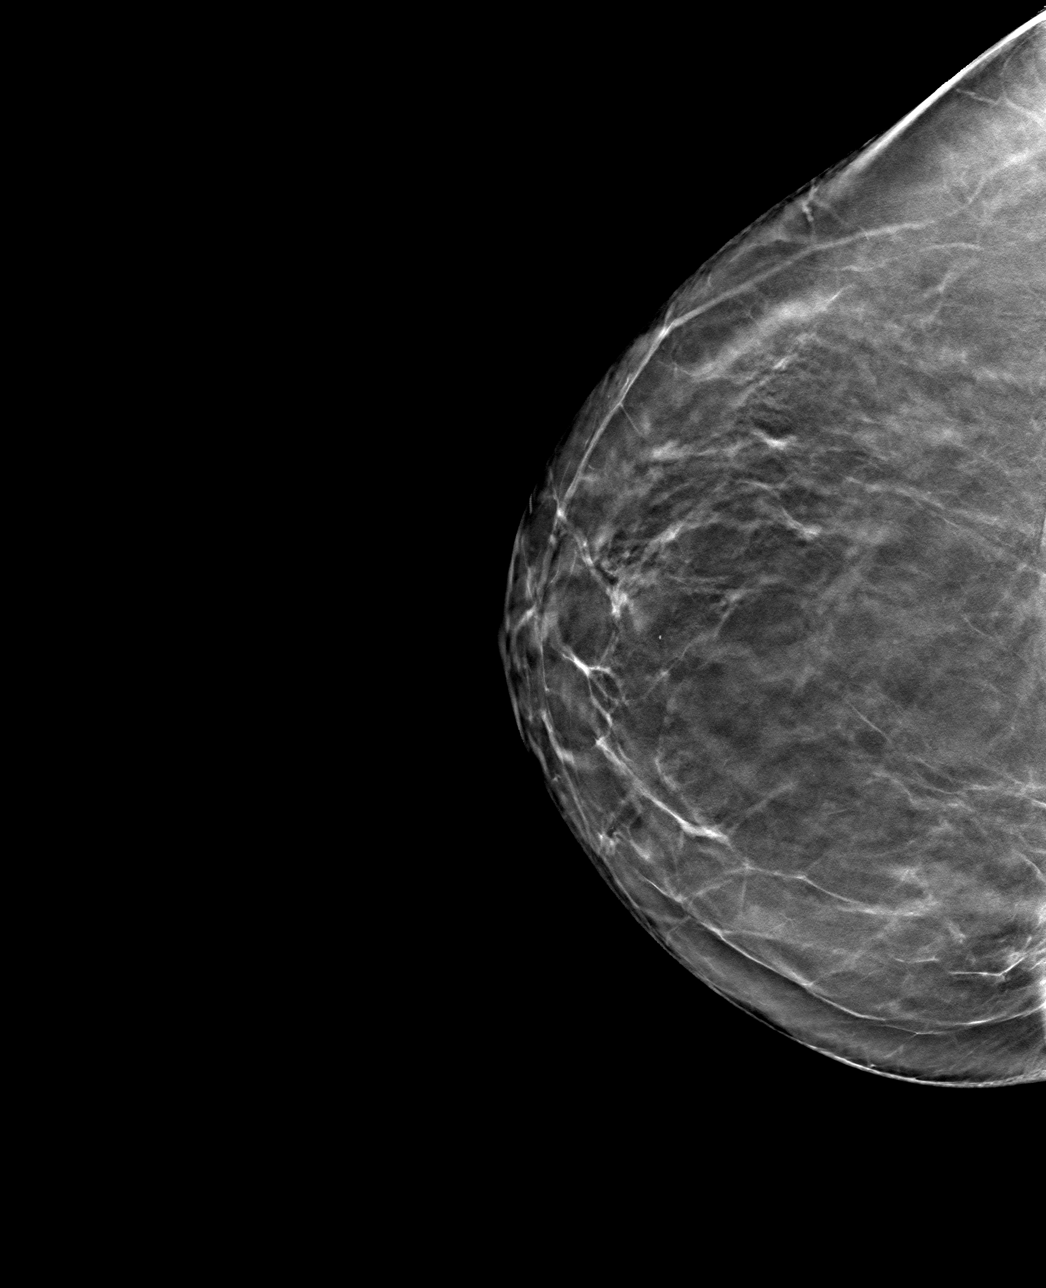

[4 of 12 positions shown; findings below may reference images not displayed]

ACR Breast Density Category b: There are scattered areas of
fibroglandular density.
FINDINGS: There are no findings suspicious for malignancy.
IMPRESSION: No mammographic evidence of malignancy. A result letter of this
screening mammogram will be mailed directly to the patient.

RECOMMENDATION:
Screening mammogram in one year. (Code:C3-I-RW2)

BI-RADS CATEGORY  1: Negative.

## 2023-05-19 ENCOUNTER — Ambulatory Visit: Payer: Medicare Other | Admitting: Orthopedic Surgery

## 2023-05-19 ENCOUNTER — Other Ambulatory Visit (INDEPENDENT_AMBULATORY_CARE_PROVIDER_SITE_OTHER): Payer: Self-pay

## 2023-05-19 ENCOUNTER — Encounter: Payer: Self-pay | Admitting: Orthopedic Surgery

## 2023-05-19 VITALS — BP 131/70 | HR 94 | Ht 68.0 in | Wt 169.0 lb

## 2023-05-19 DIAGNOSIS — S42025A Nondisplaced fracture of shaft of left clavicle, initial encounter for closed fracture: Secondary | ICD-10-CM | POA: Diagnosis not present
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
## 2023-05-28 ENCOUNTER — Ambulatory Visit: Payer: Medicare Other | Admitting: Orthopedic Surgery

## 2023-05-28 ENCOUNTER — Encounter: Payer: Self-pay | Admitting: Orthopedic Surgery

## 2023-05-28 DIAGNOSIS — S42025A Nondisplaced fracture of shaft of left clavicle, initial encounter for closed fracture: Secondary | ICD-10-CM
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
## 2023-05-31 ENCOUNTER — Ambulatory Visit: Payer: Medicare Other | Admitting: Orthopedic Surgery

## 2023-06-28 ENCOUNTER — Other Ambulatory Visit (INDEPENDENT_AMBULATORY_CARE_PROVIDER_SITE_OTHER): Payer: Medicare Other

## 2023-06-28 ENCOUNTER — Ambulatory Visit (INDEPENDENT_AMBULATORY_CARE_PROVIDER_SITE_OTHER): Payer: Medicare Other | Admitting: Orthopedic Surgery

## 2023-06-28 DIAGNOSIS — S42025A Nondisplaced fracture of shaft of left clavicle, initial encounter for closed fracture: Secondary | ICD-10-CM | POA: Diagnosis not present

## 2023-06-28 DIAGNOSIS — S42025D Nondisplaced fracture of shaft of left clavicle, subsequent encounter for fracture with routine healing: Secondary | ICD-10-CM
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
## 2023-08-02 ENCOUNTER — Ambulatory Visit (INDEPENDENT_AMBULATORY_CARE_PROVIDER_SITE_OTHER): Payer: Medicare Other | Admitting: Orthopedic Surgery

## 2023-08-02 ENCOUNTER — Telehealth: Payer: Self-pay | Admitting: Orthopedic Surgery

## 2023-08-02 ENCOUNTER — Other Ambulatory Visit (INDEPENDENT_AMBULATORY_CARE_PROVIDER_SITE_OTHER): Payer: Medicare Other

## 2023-08-02 DIAGNOSIS — S42025D Nondisplaced fracture of shaft of left clavicle, subsequent encounter for fracture with routine healing: Secondary | ICD-10-CM

## 2023-08-02 NOTE — Telephone Encounter (Signed)
Dr. Mort Sawyers pt - pt lvm stating she wants to speak to Dr. Mort Sawyers nurse.  She stated that she doesn't know if she can wear her prosthetic in her bra due to the weight of it.  [phone]

## 2023-08-02 NOTE — Telephone Encounter (Signed)
No she should not unless its not painful I left message for her to call back so we can discuss If she wants to try it on ok to try on and see if it hurts, dont wear until its not painful when she puts it on.
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
## 2023-08-03 NOTE — Telephone Encounter (Signed)
I called her to advise, she has voiced understanding Will not use prosthetic  until its not painful when she puts it on/ per Dr Rexene Edison

## 2023-08-09 ENCOUNTER — Telehealth: Payer: Self-pay

## 2023-08-09 NOTE — Telephone Encounter (Signed)
Patient left message stated that she has not heard from PT and was told that if she hasn't heard from them to call us back. She is asking for a return call about this issue. [phone].

## 2023-08-09 NOTE — Telephone Encounter (Signed)
I called patient told her I sent order will resend to make sure they have it. Phone: [phone] is the number there, I asked her to call and schedule.

## 2023-09-09 DIAGNOSIS — S42025D Nondisplaced fracture of shaft of left clavicle, subsequent encounter for fracture with routine healing: Secondary | ICD-10-CM | POA: Insufficient documentation

## 2023-09-13 ENCOUNTER — Ambulatory Visit: Payer: Medicare Other | Admitting: Orthopedic Surgery

## 2023-09-13 VITALS — Ht 66.0 in | Wt 171.0 lb

## 2023-09-13 DIAGNOSIS — S42025D Nondisplaced fracture of shaft of left clavicle, subsequent encounter for fracture with routine healing: Secondary | ICD-10-CM
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
